# Patient Record
Sex: Female | Born: 1955 | Race: White | Hispanic: No | Marital: Married | Smoking: Current every day smoker
Health system: Southern US, Community
[De-identification: ages and names within clinical notes are randomized; demographics above are authoritative.]

## PROBLEM LIST (undated history)

## (undated) DIAGNOSIS — E039 Hypothyroidism, unspecified: Secondary | ICD-10-CM

## (undated) DIAGNOSIS — C4490 Unspecified malignant neoplasm of skin, unspecified: Secondary | ICD-10-CM

## (undated) DIAGNOSIS — S32010A Wedge compression fracture of first lumbar vertebra, initial encounter for closed fracture: Secondary | ICD-10-CM

## (undated) DIAGNOSIS — R319 Hematuria, unspecified: Secondary | ICD-10-CM

## (undated) DIAGNOSIS — M810 Age-related osteoporosis without current pathological fracture: Secondary | ICD-10-CM

## (undated) DIAGNOSIS — H04123 Dry eye syndrome of bilateral lacrimal glands: Secondary | ICD-10-CM

## (undated) DIAGNOSIS — I8390 Asymptomatic varicose veins of unspecified lower extremity: Secondary | ICD-10-CM

## (undated) DIAGNOSIS — K635 Polyp of colon: Secondary | ICD-10-CM

## (undated) DIAGNOSIS — N200 Calculus of kidney: Secondary | ICD-10-CM

## (undated) HISTORY — DX: Asymptomatic varicose veins of unspecified lower extremity: I83.90

## (undated) HISTORY — DX: Age-related osteoporosis without current pathological fracture: M81.0

## (undated) HISTORY — PX: ABDOMINAL HYSTERECTOMY: SHX81

## (undated) HISTORY — DX: Polyp of colon: K63.5

## (undated) HISTORY — DX: Hematuria, unspecified: R31.9

## (undated) HISTORY — PX: SPINE SURGERY: SHX786

## (undated) HISTORY — DX: Hypothyroidism, unspecified: E03.9

## (undated) HISTORY — DX: Unspecified malignant neoplasm of skin, unspecified: C44.90

## (undated) HISTORY — DX: Calculus of kidney: N20.0

## (undated) HISTORY — DX: Dry eye syndrome of bilateral lacrimal glands: H04.123

## (undated) HISTORY — PX: FRACTURE SURGERY: SHX138

## (undated) HISTORY — DX: Wedge compression fracture of first lumbar vertebra, initial encounter for closed fracture: S32.010A

---

## 1984-12-24 HISTORY — PX: OTHER SURGICAL HISTORY: SHX169

## 1996-12-24 HISTORY — PX: DILATION AND CURETTAGE OF UTERUS: SHX78

## 1997-12-24 HISTORY — PX: TOTAL ABDOMINAL HYSTERECTOMY: SHX209

## 1998-12-07 ENCOUNTER — Inpatient Hospital Stay (HOSPITAL_COMMUNITY): Admission: RE | Admit: 1998-12-07 | Discharge: 1998-12-08 | Payer: Self-pay | Admitting: Obstetrics & Gynecology

## 1998-12-29 ENCOUNTER — Ambulatory Visit (HOSPITAL_COMMUNITY): Admission: RE | Admit: 1998-12-29 | Discharge: 1998-12-29 | Payer: Self-pay | Admitting: Obstetrics and Gynecology

## 1998-12-29 ENCOUNTER — Encounter: Payer: Self-pay | Admitting: Obstetrics and Gynecology

## 2000-10-28 ENCOUNTER — Other Ambulatory Visit: Admission: RE | Admit: 2000-10-28 | Discharge: 2000-10-28 | Payer: Self-pay | Admitting: Obstetrics & Gynecology

## 2001-01-29 ENCOUNTER — Ambulatory Visit (HOSPITAL_COMMUNITY): Admission: RE | Admit: 2001-01-29 | Discharge: 2001-01-29 | Payer: Self-pay | Admitting: Gastroenterology

## 2001-11-05 ENCOUNTER — Other Ambulatory Visit: Admission: RE | Admit: 2001-11-05 | Discharge: 2001-11-05 | Payer: Self-pay | Admitting: Obstetrics & Gynecology

## 2002-11-23 ENCOUNTER — Other Ambulatory Visit: Admission: RE | Admit: 2002-11-23 | Discharge: 2002-11-23 | Payer: Self-pay | Admitting: Obstetrics & Gynecology

## 2002-12-24 HISTORY — PX: CERVICAL FUSION: SHX112

## 2003-08-02 ENCOUNTER — Encounter: Payer: Self-pay | Admitting: Orthopaedic Surgery

## 2003-08-04 ENCOUNTER — Ambulatory Visit (HOSPITAL_COMMUNITY): Admission: RE | Admit: 2003-08-04 | Discharge: 2003-08-05 | Payer: Self-pay | Admitting: Orthopaedic Surgery

## 2003-08-04 ENCOUNTER — Encounter: Payer: Self-pay | Admitting: Orthopaedic Surgery

## 2003-08-05 ENCOUNTER — Encounter: Payer: Self-pay | Admitting: Orthopaedic Surgery

## 2003-11-29 ENCOUNTER — Other Ambulatory Visit: Admission: RE | Admit: 2003-11-29 | Discharge: 2003-11-29 | Payer: Self-pay | Admitting: Obstetrics & Gynecology

## 2003-12-03 ENCOUNTER — Encounter: Admission: RE | Admit: 2003-12-03 | Discharge: 2003-12-03 | Payer: Self-pay | Admitting: Obstetrics & Gynecology

## 2004-09-23 DIAGNOSIS — N200 Calculus of kidney: Secondary | ICD-10-CM

## 2004-09-23 HISTORY — DX: Calculus of kidney: N20.0

## 2004-10-03 ENCOUNTER — Encounter: Admission: RE | Admit: 2004-10-03 | Discharge: 2004-10-03 | Payer: Self-pay | Admitting: Internal Medicine

## 2004-11-22 ENCOUNTER — Ambulatory Visit (HOSPITAL_BASED_OUTPATIENT_CLINIC_OR_DEPARTMENT_OTHER): Admission: RE | Admit: 2004-11-22 | Discharge: 2004-11-22 | Payer: Self-pay | Admitting: Orthopedic Surgery

## 2005-01-02 ENCOUNTER — Other Ambulatory Visit: Admission: RE | Admit: 2005-01-02 | Discharge: 2005-01-02 | Payer: Self-pay | Admitting: Obstetrics & Gynecology

## 2006-02-01 ENCOUNTER — Other Ambulatory Visit: Admission: RE | Admit: 2006-02-01 | Discharge: 2006-02-01 | Payer: Self-pay | Admitting: Obstetrics & Gynecology

## 2007-09-25 ENCOUNTER — Encounter: Admission: RE | Admit: 2007-09-25 | Discharge: 2007-09-25 | Payer: Self-pay | Admitting: Internal Medicine

## 2008-12-24 DIAGNOSIS — S32010A Wedge compression fracture of first lumbar vertebra, initial encounter for closed fracture: Secondary | ICD-10-CM

## 2008-12-24 HISTORY — DX: Wedge compression fracture of first lumbar vertebra, initial encounter for closed fracture: S32.010A

## 2010-11-08 ENCOUNTER — Ambulatory Visit: Payer: Self-pay | Admitting: Vascular Surgery

## 2011-01-13 ENCOUNTER — Encounter: Payer: Self-pay | Admitting: Obstetrics & Gynecology

## 2011-01-29 ENCOUNTER — Encounter (INDEPENDENT_AMBULATORY_CARE_PROVIDER_SITE_OTHER): Payer: Self-pay | Admitting: *Deleted

## 2011-02-08 ENCOUNTER — Encounter (INDEPENDENT_AMBULATORY_CARE_PROVIDER_SITE_OTHER): Payer: Self-pay | Admitting: *Deleted

## 2011-02-08 NOTE — Letter (Signed)
Summary: Pre Visit Letter Revised  [facility]  [address] [place], [place] [address]   Phone: [phone]  Fax: [phone]        01/29/2011 MRN: [id number] [facility] [address] [place], [place]  [address]             Procedure Date: February 26, 2011   recall col - Dr Tomasita Morrow to the Gastroenterology Division at [facility].    You are scheduled to see a nurse for your pre-procedure visit on February 12, 2011 at 10:00am on the 3rd floor at [facility], [address]  We ask that you try to arrive at our office 15 minutes prior to your appointment time to allow for check-in.  Please take a minute to review the attached form.  If you answer "Yes" to one or more of the questions on the first page, we ask that you call the person listed at your earliest opportunity.  If you answer "No" to all of the questions, please complete the rest of the form and bring it to your appointment.    Your nurse visit will consist of discussing your medical and surgical history, your immediate family medical history, and your medications.   If you are unable to list all of your medications on the form, please bring the medication bottles to your appointment and we will list them.  We will need to be aware of both prescribed and over the counter drugs.  We will need to know exact dosage information as well.    Please be prepared to read and sign documents such as consent forms, a financial agreement, and acknowledgement forms.  If necessary, and with your consent, a friend or relative is welcome to sit-in on the nurse visit with you.  Please bring your insurance card so that we may make a copy of it.  If your insurance requires a referral to see a specialist, please bring your referral form from your primary care physician.  No co-pay is required for this nurse visit.     If you cannot keep your appointment, please call [phone] to cancel or reschedule  prior to your appointment date.  This allows [place] the opportunity to schedule an appointment for another patient in need of care.    Thank you for choosing [facility] for your medical needs.  We appreciate the opportunity to care for you.  Please visit [place] at our website  to learn more about our practice.  Sincerely, The Gastroenterology Division

## 2011-02-12 ENCOUNTER — Encounter: Payer: Self-pay | Admitting: Gastroenterology

## 2011-02-13 ENCOUNTER — Encounter (INDEPENDENT_AMBULATORY_CARE_PROVIDER_SITE_OTHER): Payer: 59

## 2011-02-13 ENCOUNTER — Ambulatory Visit (INDEPENDENT_AMBULATORY_CARE_PROVIDER_SITE_OTHER): Payer: 59 | Admitting: Vascular Surgery

## 2011-02-13 DIAGNOSIS — I83893 Varicose veins of bilateral lower extremities with other complications: Secondary | ICD-10-CM

## 2011-02-14 NOTE — Assessment & Plan Note (Signed)
OFFICE VISIT  Sara Ingram, Sara Ingram DOB:  1956/09/10                                       02/13/2011 EAVWU#:[id number]  The patient presents today for continued discussion regarding her venous pathology.  She continues to have discomfort over varicosities in her posterior popliteal space extending down onto her calf and also on the left side, and then also on the right side she has pain over varicosities in her right medial calf.  She reports that she has had no improvement with compression garment usage.  She does continue to elevate her legs and take ibuprofen for the discomfort.  She reports that cooking, cleaning and shopping and all active requiring prolonged standing are difficult due to leg pain and also, she walks for exercise on a treadmill and this has had to reduce her duration due to leg pain as well.  Physical exam is unchanged.  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  She underwent formal venous duplex today in our office and this demonstrated reflux throughout her right great saphenous vein extending into the veins in her medial right calf.  On the left the varicosities arise off the small saphenous vein but there is no demonstratable reflux throughout the saphenous vein on the left popliteal area.  I have recommended treatment since she is having continued difficulty.  She reports that this is more so on the left than on the right.  On the left I have recommended stab phlebectomy over tributary varicosities and sclerotherapy of the telangiectasia that are causing pain in the skin themselves.  On the right I would recommend staged a treatment following left leg treatment. On the right I would recommend laser ablation of her right great saphenous vein and stab phlebectomy of varicosities, and she understands these are outpatient procedure under local anesthesia in our office and wished to  proceed as soon as possible.    Larina Earthly, M.D.  TFE/MEDQ  D:  02/13/2011  T:  02/14/2011  Job:  [id number]  cc:   Thora Lance, M.D.

## 2011-02-20 NOTE — Letter (Signed)
Summary: Moviprep Instructions  [facility]  [address]   [place], [place] [address]   Phone: [phone]  Fax: [phone]       Sara Ingram    1957/55/04    MRN: [id number]        Procedure Day /Date: Monday, 02-26-11     Arrival Time: 9:30 a.m.      Procedure Time: 10:30 a.m.     Location of Procedure:                     x  [facility] (4th Floor)                        PREPARATION FOR COLONOSCOPY WITH MOVIPREP   Starting 5 days prior to your procedure 02-21-11 do not eat nuts, seeds, popcorn, corn, beans, peas,  salads, or any raw vegetables.  Do not take any fiber supplements (e.g. Metamucil, Citrucel, and Benefiber).  THE DAY BEFORE YOUR PROCEDURE         DATE: 02-25-11  DAY: Sunday  1.  Drink clear liquids the entire day-NO SOLID FOOD  2.  Do not drink anything colored red or purple.  Avoid juices with pulp.  No orange juice.  3.  Drink at least 64 oz. (8 glasses) of fluid/clear liquids during the day to prevent dehydration and help the prep work efficiently.  CLEAR LIQUIDS INCLUDE: Water Jello Ice Popsicles Tea (sugar ok, no milk/cream) Powdered fruit flavored drinks Coffee (sugar ok, no milk/cream) Gatorade Juice: apple, white grape, white cranberry  Lemonade Clear bullion, consomm, broth Carbonated beverages (any kind) Strained chicken noodle soup Hard Candy                             4.  In the morning, mix first dose of MoviPrep solution:    Empty 1 Pouch A and 1 Pouch B into the disposable container    Add lukewarm drinking water to the top line of the container. Mix to dissolve    Refrigerate (mixed solution should be used within 24 hrs)  5.  Begin drinking the prep at 5:00 p.m. The MoviPrep container is divided by 4 marks.   Every 15 minutes drink the solution down to the next mark (approximately 8 oz) until the full liter is complete.   6.  Follow completed prep with 16 oz of clear liquid of your choice  (Nothing red or purple).  Continue to drink clear liquids until bedtime.  7.  Before going to bed, mix second dose of MoviPrep solution:    Empty 1 Pouch A and 1 Pouch B into the disposable container    Add lukewarm drinking water to the top line of the container. Mix to dissolve    Refrigerate  THE DAY OF YOUR PROCEDURE      DATE: 02-26-11 DAY: Monday  Beginning at 5:30 a.m. (5 hours before procedure):         1. Every 15 minutes, drink the solution down to the next mark (approx 8 oz) until the full liter is complete.  2. Follow completed prep with 16 oz. of clear liquid of your choice.    3. You may drink clear liquids until 8:30 a.m. (2 HOURS BEFORE PROCEDURE).   MEDICATION INSTRUCTIONS  Unless otherwise instructed, you should take regular prescription medications with a small sip of water   as early as possible the morning  of your procedure.           OTHER INSTRUCTIONS  You will need a responsible adult at least 55 years of age to accompany you and drive you home.   This person must remain in the waiting room during your procedure.  Wear loose fitting clothing that is easily removed.  Leave jewelry and other valuables at home.  However, you may wish to bring a book to read or  an iPod/MP3 player to listen to music as you wait for your procedure to start.  Remove all body piercing jewelry and leave at home.  Total time from sign-in until discharge is approximately 2-3 hours.  You should go home directly after your procedure and rest.  You can resume normal activities the  day after your procedure.  The day of your procedure you should not:   Drive   Make legal decisions   Operate machinery   Drink alcohol   Return to work  You will receive specific instructions about eating, activities and medications before you leave.    The above instructions have been reviewed and explained to me by   Ezra Sites RN  February 12, 2011 10:15 AM     I fully  understand and can verbalize these instructions _____________________________ Date _________

## 2011-02-20 NOTE — Miscellaneous (Signed)
Summary: [facility] PV  Clinical Lists Changes  Medications: Added new medication of MOVIPREP 100 GM  SOLR (PEG-KCL-NACL-NASULF-NA ASC-C) As per prep instructions. - Signed Rx of MOVIPREP 100 GM  SOLR (PEG-KCL-NACL-NASULF-NA ASC-C) As per prep instructions.;  #1 x 0;  Signed;  Entered by: Ezra Sites RN;  Authorized by: Mardella Layman MD [facility];  Method used: Electronically to [facility]. [place]. [phone]*, [address] [place], [place], [place]  [address], Ph: [phone] or [phone], Fax: [phone] Observations: Added new observation of NKA: T (02/12/2011 9:54)    Prescriptions: MOVIPREP 100 GM  SOLR (PEG-KCL-NACL-NASULF-NA ASC-C) As per prep instructions.  #1 x 0   Entered by:   Ezra Sites RN   Authorized by:   Mardella Layman MD [facility]   Signed by:   Ezra Sites RN on 02/12/2011   Method used:   Electronically to        [facility]. [address] [phone]* (retail)       [address]       [place], [place]  [address]       Ph: [phone] or [phone]       Fax: [phone]   RxID:   [id number]

## 2011-02-20 NOTE — Procedures (Unsigned)
LOWER EXTREMITY VENOUS REFLUX EXAM  INDICATION:  Varicose veins.  EXAM:  Using color-flow imaging and pulse Doppler spectral analysis, the right and left common femoral, superficial femoral, popliteal, posterior tibial, greater and lesser saphenous veins were evaluated.  There is evidence suggesting deep venous insufficiency in the right lower extremity.  The right and left saphenofemoral junction is competent.  The right GSV is not competent with reflux of 500 milliseconds with the caliber as described below.  The left greater saphenous vein is competent.  The right and left proximal short saphenous vein demonstrate competency.  GSV Diameter (used if found to be incompetent only)                                           Right    Left Proximal Greater Saphenous Vein           0.46 cm  cm Proximal-to-mid-thigh                     0.39 cm  cm Mid thigh                                 0.37 cm  cm Mid-distal thigh                          cm       cm Distal thigh                              0.31 cm  cm Knee                                      cm       cm  IMPRESSION:  The right greater saphenous vein is not competent with reflux of >500 milliseconds.  The left greater saphenous vein is competent.  The right and left greater saphenous vein is not tortuous. The deep venous systems on the right is not competent with reflux of >500 milliseconds.  The right and left short saphenous vein is competent.  The patient has had a history of bilateral venous treatment.  ___________________________________________ Larina Earthly, M.D.  OD/MEDQ  D:  02/13/2011  T:  02/13/2011  Job:  [id number]

## 2011-02-26 ENCOUNTER — Other Ambulatory Visit: Payer: Self-pay | Admitting: Gastroenterology

## 2011-02-26 ENCOUNTER — Other Ambulatory Visit (AMBULATORY_SURGERY_CENTER): Payer: 59 | Admitting: Gastroenterology

## 2011-02-26 DIAGNOSIS — Z86010 Personal history of colonic polyps: Secondary | ICD-10-CM

## 2011-02-26 DIAGNOSIS — K6389 Other specified diseases of intestine: Secondary | ICD-10-CM

## 2011-02-26 DIAGNOSIS — D126 Benign neoplasm of colon, unspecified: Secondary | ICD-10-CM

## 2011-02-26 DIAGNOSIS — Z1211 Encounter for screening for malignant neoplasm of colon: Secondary | ICD-10-CM

## 2011-03-02 ENCOUNTER — Encounter: Payer: Self-pay | Admitting: Gastroenterology

## 2011-03-06 NOTE — Procedures (Addendum)
Summary: Colonoscopy  Patient: [name] Note: All result statuses are Final unless otherwise noted.  Tests: (1) Colonoscopy (COL)   COL Colonoscopy           DONE     [facility]     [address]     [place], [place]  [address]          COLONOSCOPY PROCEDURE REPORT          PATIENT:  Sara Ingram, Sara Ingram  MR#:  [id number]     BIRTHDATE:  1956/06/18, 54 yrs. old  GENDER:  female     ENDOSCOPIST:  [name]. [name], MD, [facility]     REF. BY:     PROCEDURE DATE:  02/26/2011     PROCEDURE:  Colonoscopy with biopsy     ASA CLASS:  Class II     INDICATIONS:  history of hyperplastic polyps     MEDICATIONS:   Fentanyl 75 mcg IV, Versed 8 mg IV, Benadryl 25 mg     IV          DESCRIPTION OF PROCEDURE:   After the risks benefits and     alternatives of the procedure were thoroughly explained, informed     consent was obtained.  Digital rectal exam was performed and     revealed no abnormalities.   The LB CF-H180AL [id number] endoscope     was introduced through the anus and advanced to the cecum, which     was identified by both the appendix and ileocecal valve, without     limitations.  The quality of the prep was good, using MoviPrep.     The instrument was then slowly withdrawn as the colon was fully     examined.     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  There     were multiple polyps identified and removed. in the left colon.     SMALL 1-2 MM FLAT LEFT COLON POLYPS COLD BX. REMOVED.     Retroflexed views in the rectum revealed no abnormalities.    The     scope was then withdrawn from the patient and the procedure     completed.          COMPLICATIONS:  None     ENDOSCOPIC IMPRESSION:     1) Melanosis throughout the colon     2) Polyps, multiple in the left colon     R/O ADENOMAS,,,     RECOMMENDATIONS:     1) Repeat colonoscopy in 5 years if polyp adenomatous; otherwise     10 years     REPEAT EXAM:  No       ______________________________     [name]. [name], MD, [facility]          CC:  Kirby Funk, MD          n.     [name]:   [name]. Patterson at 02/26/2011 11:38 AM          [name], [id number]  Note: An exclamation mark (!) indicates a result that was not dispersed into the flowsheet. Document Creation Date: 02/26/2011 11:39 AM _______________________________________________________________________  (1) Order result status: Final Collection or observation date-time: 02/26/2011 11:30 Requested date-time:  Receipt date-time:  Reported date-time:  Referring Physician:   Ordering Physician: [name] [phone]) Specimen Source:  Source: [name] Order Number: [id number] Lab site:   Appended Document: Colonoscopy  five-year followup  Appended Document: Colonoscopy     Procedures Next Due Date:    Colonoscopy: 02/2016

## 2011-03-06 NOTE — Letter (Addendum)
Summary: Patient Notice- Polyp Results  [facility]  [address] [place], [place] [address]   Phone: [phone]  Fax: [phone]        March 02, 2011 MRN: [id number]    [facility] [address] [place], [place]  [address]    Dear Sara Ingram,  I am pleased to inform you that the colon polyp(s) removed during your recent colonoscopy was (were) found to be benign (no cancer detected) upon pathologic examination.  I recommend you have a repeat colonoscopy examination in 5_ years to look for recurrent polyps, as having colon polyps increases your risk for having recurrent polyps or even colon cancer in the future.  Should you develop new or worsening symptoms of abdominal pain, bowel habit changes or bleeding from the rectum or bowels, please schedule an evaluation with either your primary care physician or with me.  Additional information/recommendations:  _xx_ No further action with gastroenterology is needed at this time. Please      follow-up with your primary care physician for your other healthcare      needs.  __ Please call [phone] to schedule a return visit to review your      situation.  __ Please keep your follow-up visit as already scheduled.  __ Continue treatment plan as outlined the day of your exam.  Please call us if you are having persistent problems or have questions about your condition that have not been fully answered at this time.  Sincerely,  Mardella Layman MD [facility]  This letter has been electronically signed by your physician.  Appended Document: Patient Notice- Polyp Results letter mailed

## 2011-03-14 ENCOUNTER — Encounter (INDEPENDENT_AMBULATORY_CARE_PROVIDER_SITE_OTHER): Payer: 59 | Admitting: Vascular Surgery

## 2011-03-14 DIAGNOSIS — I83893 Varicose veins of bilateral lower extremities with other complications: Secondary | ICD-10-CM

## 2011-03-15 NOTE — Assessment & Plan Note (Signed)
OFFICE VISIT  Sara Ingram, Sara Ingram DOB:  [birth year]                                       03/14/2011 NWGNF#:[id number]  [name] presents today for treatment of her left leg venous pathology.  She underwent stab phlebectomy of 10-20 tributary varicosities in her popliteal space and posterior calf and also sclerotherapy of painful telangiectasia around this area.  She had no immediate complication and was discharged to home and will be seen again in several weeks for follow-up.    Larina Earthly, M.D. Electronically Signed  TFE/MEDQ  D:  03/14/2011  T:  03/15/2011  Job:  [id number]

## 2011-04-04 ENCOUNTER — Ambulatory Visit (INDEPENDENT_AMBULATORY_CARE_PROVIDER_SITE_OTHER): Payer: 59 | Admitting: Vascular Surgery

## 2011-04-04 DIAGNOSIS — I83893 Varicose veins of bilateral lower extremities with other complications: Secondary | ICD-10-CM

## 2011-04-05 NOTE — Assessment & Plan Note (Signed)
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  This was on March 14, 2011.  She is having a moderate amount of bruising and discomfort.  She has areas of thickening where the phlebectomy was undertaken with some blood under the skin.  She reports that she continued to have discomfort over this area which may be related to postoperative changes since she is now 3 weeks out.  She does not have any evidence of any infection or other postoperative complications.  She will continue to wear her compression garments since this is giving her some relief and will continue with her activity program.  I do not see any other options for treatment.  She does report some tingling sensation and numbness in the bottom of feet.  I feel that this is not related to venous pathology as we had discussed before.  I suggested that if this becomes progressive and causes significant difficulty with her, she may need to speak with Dr. Valentina Lucks about further workup.  She will notify us should she develop any future problems and see [place] on an as-needed basis.    Larina Earthly, M.D. Electronically Signed  TFE/MEDQ  D:  04/04/2011  T:  04/05/2011  Job:  [id number]  cc:   Thora Lance, M.D.

## 2011-05-08 NOTE — Consult Note (Signed)
NEW PATIENT CONSULTATION   Ingram, Sara S  DOB:  Aug 27, 1956                                       11/08/2010  GEXBM#:[id number]   Sara Ingram presents today for evaluation of bilateral venous  hypertension and painful varicosities.  She is a 55 year old white  female with a long history of venous pathology.  She had prior surgery  by Dr. Francina Ames in 1986 of the left posterior calf and subsequent had  further surgery by another surgeon at [facility] on her  right and left leg in 2004.  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  She does not know what treatment had been done  other than removal of the tributary varicosities..  She does not recall  any vein stripping.  She presents today with recurrent pain.  She has an  aching and throbbing sensation over the varicosities in her left  posterior calf and right medial calf.  She also has swelling with  prolonged standing.  She denies any bleeding but was concerned regarding  a very thin the area over the area below her popliteal fossa on the left  for bleeding.  She does have a history of DVT in her left leg in her 80s  at the time childbirth and was treated for some period time with  Coumadin.  She has no cardiac disease.  She does have history of spinal  fusion and kyphoplasty.  She has a history of prior hysterectomy and C-  section.   SOCIAL HISTORY:  She is married with 2 children.  She does not work  outside the home.  She does smoke less than half pack cigarettes per  day.  Does not drink alcohol.   FAMILY HISTORY:  Positive for varicose vein surgery in her father in his  13s.   REVIEW OF SYSTEMS:  No weight loss or gain.  She weighs 115 pounds.  She  is 5 feet 3 inches tall.  VASCULAR:  History of prior blood clots and pain in her legs with  walking.  Cardiac, GU, neurologic, pulmonary, hematologic, GU, GI and ENT are all  normal and negative.  MUSCULOSKELETAL:  Positive for arthritis, joint pain and muscle pain.  PSYCHIATRIC AND SKIN:  Negative.   PHYSICAL EXAMINATION:  Well developed, well nourished white female  appearing stated age in no acute distress.  Blood pressure is 100/66, pulse 68, respirations 16.  ENT:  Normal.  CHEST:  Clear bilaterally.  HEART:  Regular rate and rhythm.  ABDOMEN:  Soft, nontender.  MUSCULOSKELETAL:  Shows no major deformity or cyanosis.  NEUROLOGIC:  Without focal weakness or paresthesias.  SKIN:  Without ulcers or rashes.  She does have marked tributary  varicosities below the level of her left popliteal fossa and medial  calf.  She has incisions from prior phlebectomies over these areas.   She underwent a screening ultrasound by myself with Sono-Site and this  shows reflux from her small saphenous vein on the left into these  tributary varicosities and from the great saphenous vein on the right.  She has worn compression garments in the past but has not worn these  recently.  I explained the significance of her venous hypertension and  explained that in all likelihood her recurrent varicosities have been  related to no prior treatment of her  valvular incompetence and venous  hypertension from her saphenous veins.  She has been fitted today with  graduated compression garments 20-30 mmHg and instructed on their use.  I will see her again in 3 months with a formal duplex to determine if  that she has had any improvement with compression garments.  She  understands she is a candidate for laser ablation and stab phlebectomy  bilaterally should the had a compression garments and elevation and  ibuprofen fail to improve her symptoms.     Larina Earthly, M.D.  Electronically Signed   TFE/MEDQ  D:  11/08/2010  T:  11/09/2010  Job:  [id number]   cc:   Thora Lance, M.D.

## 2011-05-11 NOTE — H&P (Signed)
NAME:  Sara Ingram, Sara Ingram                       ACCOUNT NO.:  [id number]   MEDICAL RECORD NO.:  [id number]                   PATIENT TYPE:  OIB   LOCATION:  2899                                 FACILITY:  [facility]   PHYSICIAN:  Sharolyn Douglas, M.D.                     DATE OF BIRTH:  Apr 14, 1956   DATE OF ADMISSION:  08/04/2003  DATE OF DISCHARGE:                                HISTORY & PHYSICAL   CHIEF COMPLAINT:  Neck and left upper extremity pain.   HISTORY OF PRESENT ILLNESS:  The patient is a 55 year old female who has  been having neck and left upper extremity pain until approximately two  months ago when she was at work.  She works for a sh[employer].  She was trying  to move a heavy carton when she dropped it and jerked her neck.  She felt a  pop with immediate sensation of a burning pain in her neck and parascapular  area and into her left arm.  She was able to continue work for several days;  however, her pain continued and gradually increased and began moving all the  way down her arm and into her left hand.  She had been treated with numerous  types of conservative measures including anti-inflammatories, pain  medications, physical therapy which did not improve any of her symptoms.  MRI showed a large herniated disk at C5-6.  Secondary to this, as well as  her continued severe pain which was significantly affecting her quality of  life and activities of daily living, risks and benefits of the proposed  surgery were discussed with the patient by Dr. Noel Gerold, as well as myself.  She indicated understanding and opted to proceed.   ALLERGIES:  No known drug allergies.   MEDICATIONS:  Vicodin as needed.   PAST MEDICAL HISTORY:  Healthy.   PAST SURGICAL HISTORY:  Hysterectomy in 1999 and C-section x2 in 1981 and  1977.   SOCIAL HISTORY:  The patient denies tobacco use, denies alcohol use.  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  She has two grown children.  Her  husband  works full time but will be available to help her, as well as some  other family members help her through her postoperative course.   FAMILY MEDICAL HISTORY:  Mother alive at age 55 and healthy.  Father alive  at age 57 and healthy.  She has some siblings that are also healthy.   REVIEW OF SYSTEMS:  She does have nausea on occasion secondary to pain  medications.  Otherwise, she denies any fevers, chills, sweats, or bleeding  tendencies.  CNS:  Denies blurred vision, double vision, seizures,  headaches, paralysis.  CARDIOVASCULAR:  Denies chest pain, angina,  orthopnea, claudication, or palpitations.  PULMONARY:  Denies shortness of  breath, productive cough, or hemoptysis.  GI:  She has nausea as  previously  dictated.  Denies vomiting, constipation, diarrhea, bloody stool, or melena.  GU:  Denies dysuria, hematuria, or discharge.  MUSCULOSKELETAL:  As per HPI.   PHYSICAL EXAMINATION:  VITAL SIGNS:  Blood pressure 110/72, respirations 16  and unlabored, pulse is 82 and regular.  GENERAL:  The patient is a 55 year old female who is alert and oriented in  no acute distress.  She is well nourished, well groomed, appears her stated  age.  Pleasant and cooperative to examination.  She does appear to be quite  uncomfortable throughout the exam.  HEENT:  Head is normocephalic, atraumatic.  Pupils are equal, round, and  reactive.  Extraocular movements intact.  Nares patent.  Pharynx is clear.  NECK:  Soft to palpation.  No lymphadenopathy, thyromegaly, or bruits  appreciated.  CHEST:  Clear to auscultation bilaterally.  No rales, rhonchi, stridor,  wheezes, or friction rubs.  BREASTS:  Not pertinent, not performed.  HEART:  S1, S2.  Regular rate and rhythm.  No murmurs, gallops, or rubs were  noted.  ABDOMEN:  Soft to palpation.  Nontender, nondistended.  No organomegaly  noted.  Positive bowel sounds throughout.  GENITOURINARY:  Not pertinent, not performed.  EXTREMITIES:  The patient has  significant left upper extremity pain.  Motor  function is grossly intact.  Sensation is grossly intact to all extremities.  Pulses are intact and symmetric.  SKIN:  Intact without any lesions or rashes.   MRI shows a large HNP at C5-6.   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   PLAN:  Admit to [facility] on August 04, 2003 for an anterior  cervical diskectomy of C5-6 to be done by Dr. Sharolyn Douglas.      Verlin Fester, P.A.                       Sharolyn Douglas, M.D.    CM/MEDQ  D:  08/04/2003  T:  08/04/2003  Job:  [id number]

## 2011-05-11 NOTE — Op Note (Signed)
Sara Ingram, Sara Ingram                ACCOUNT NO.:  [id number]   MEDICAL RECORD NO.:  [id number]          PATIENT TYPE:  AMB   LOCATION:  N[facility]C                         FACILITY:  [facility]   PHYSICIAN:  Georges Lynch. Gioffre, M.D.DATE OF BIRTH:  11-26-1956   DATE OF PROCEDURE:  11/22/2004  DATE OF DISCHARGE:                                 OPERATIVE REPORT   PREOPERATIVE DIAGNOSIS:  Frozen shoulder on the left.   POSTOPERATIVE DIAGNOSIS:  Frozen shoulder on the left.   OPERATION:  1.  Closed manipulation left shoulder under general anesthesia.  2.  Did a sterile prep and injected her left shoulder with Kenalog 1 mL and      0.5% Marcaine 5 mL in left shoulder.   SURGEON:  Georges Lynch. Darrelyn Hillock, M.D.   ASSISTANT:  Nurse.   DESCRIPTION OF PROCEDURE:  Under general anesthesia, a gradual gentle  routine closed manipulation of her shoulder on the left was carried out.  It  was very difficult to get any further internal rotation on her but we did  try that as well.  We were able to easily get her arm above her head without  any problem in external rotation.  I then did a sterile prep and injected  her left shoulder with 1 mL Kenalog and 5 mL 0.5 Marcaine.  We will see her  in the office as scheduled for her therapy.      RAG/MEDQ  D:  11/22/2004  T:  11/22/2004  Job:  [id number]

## 2011-05-11 NOTE — Op Note (Signed)
NAME:  Sara Ingram, Sara Ingram                       ACCOUNT NO.:  [id number]   MEDICAL RECORD NO.:  [id number]                   PATIENT TYPE:  OIB   LOCATION:  2899                                 FACILITY:  [facility]   PHYSICIAN:  Sharolyn Douglas, M.D.                     DATE OF BIRTH:  11/16/1956   DATE OF PROCEDURE:  08/04/2003  DATE OF DISCHARGE:                                 OPERATIVE REPORT   PREOPERATIVE DIAGNOSIS:  C5-C6 herniated nucleus pulposus with severe left  C6 radiculopathy.   POSTOPERATIVE DIAGNOSIS:  C5-C6 herniated nucleus pulposus with severe left  C6 radiculopathy.   PROCEDURE:  1. Anterior cervical discectomy C5-C6 with decompression of the spinal cord     and nerve roots bilaterally.  2. Anterior cervical arthrodesis with placement of an 8 mm Synthes allograft     prosthesis spacer packed with demineralized bone matrix.  3. Anterior cervical plating utilizing the EBI VueLock system C5-C6.  4. Neuromonitoring utilizing somatosensory evoked potentials and EMGs.   SURGEON:  Sharolyn Douglas, M.D.   ASSISTANT:  Stacie Glaze, P.A.   ANESTHESIA:  General endotracheal anesthesia.   COMPLICATIONS:  None.   INDICATIONS FOR PROCEDURE:  The patient is a 55 year old female who was  injured at work two months ago.  She had immediate pain and burning in her  neck with radiation to the parascapular area and left arm.  She had an MRI  scan which showed a large herniated disc at C5-C6.  She has failed  conservative treatment options.  She has elected to undergo anterior  cervical discectomy and fusion at C5-C6 in hopes of improving her symptoms.  The risks, benefits, and alternatives were extensively reviewed.   PROCEDURE:  The patient was properly identified in the holding area and  taken to the operating room.  She underwent general endotracheal anesthesia  without difficulty.  She was given prophylactic IV antibiotics.  She was  carefully positioned onto the operating table.  Her  head was placed on the  [place] headrest.  5 pounds of halter traction was applied.  The neck was  placed in neutral position.  Neuromonitoring was established in the form of  SSEPs and upper extremity EMGs.  The neck was prepped and draped in the  usual sterile fashion.  A 3 cm incision was made on the left side in a  natural skin crease just above the cricoid cartilage.  Dissection was  carried sharply through the platysma.  The interval between the  sternocleidomastoid muscle and the strap muscles medially was developed down  to the prevertebral space.  The first disc identified was C5-C6.  This was  confirmed with interoperative x-ray.  The esophagus, trachea, and carotid  sheath were identified and protected at all times.  The longus colli muscle  was elevated out over the C5-C6 disc space bilaterally.  A deep retractor  was  placed.  Sharp annulotomy was performed.  Curets and pituitary rongeurs  were used to perform a subtotal discectomy back to the posterior  longitudinal ligament.  The surgical microscope was draped and brought into  the field.  Caspar pins were placed in the C5 and C6 vertebral bodies and  gentle distraction was applied.  A high speed bur was used to remove the  cartilaginous endplates as well as the uncovertebral joints bilaterally.  Foraminotomies were performed bilaterally with 2 mm Kerrison punches.  Two  moderate size pieces of disc material were removed from the left lateral  recess.  A rent in the posterior longitudinal ligament was identified.  The  PLL was taken down on the left side.  Additional disc material was found in  a subligamentous position.  The C6 nerve root was completely decompressed on  the left side.  The wound was copiously irrigated.  Gelfoam and bipolar  electrocautery was used to control bleeding.  We then placed an 8 mm Synthes  allograft prosthesis spacer that had been packed with demineralized bone  matrix.  The disc was carefully  recessed 1 mm.  We checked posterior to the  graft and there was plenty of room posteriorly.  We then placed an EBI  VueLock plate with four screws.  We confirmed each screw had engaged in the  locking mechanism.  Interoperative x-rays showed appropriate positioning of  the plate and screws.  The wound was again irrigated.  The esophagus,  trachea, and carotid sheath were inspected and there were no apparent  injuries.  We placed a deep Penrose drain.  The platysma was closed with an  interrupted 2-0 Vicryl, the subcutaneous layer closed with interrupted 3-0  Vicryl followed by a running 4-0 subcuticular Vicryl suture on the skin.  Benzoin and Steri-Strips were placed.  A sterile dressing was placed.  A  soft collar was placed.  The patient was extubated without difficulty,  transferred to the recovery room, able to move her upper and lower  extremities.                                               Sharolyn Douglas, M.D.    MC/MEDQ  D:  08/04/2003  T:  08/04/2003  Job:  [id number]

## 2011-09-27 ENCOUNTER — Other Ambulatory Visit: Payer: Self-pay | Admitting: Dermatology

## 2013-06-29 ENCOUNTER — Encounter (HOSPITAL_BASED_OUTPATIENT_CLINIC_OR_DEPARTMENT_OTHER): Payer: Self-pay

## 2013-06-29 ENCOUNTER — Ambulatory Visit (HOSPITAL_BASED_OUTPATIENT_CLINIC_OR_DEPARTMENT_OTHER): Admit: 2013-06-29 | Payer: 59 | Admitting: Orthopedic Surgery

## 2013-06-29 SURGERY — RELEASE, CARPAL TUNNEL, ENDOSCOPIC
Anesthesia: Monitor Anesthesia Care | Laterality: Right

## 2013-10-01 ENCOUNTER — Other Ambulatory Visit: Payer: Self-pay | Admitting: Dermatology

## 2014-07-01 ENCOUNTER — Ambulatory Visit
Admission: RE | Admit: 2014-07-01 | Discharge: 2014-07-01 | Disposition: A | Discharge status: Home / Self Care | Payer: Worker's Compensation | Source: Ambulatory Visit | Attending: Family | Admitting: Family

## 2014-07-01 ENCOUNTER — Other Ambulatory Visit: Payer: Self-pay | Admitting: Family

## 2014-07-01 DIAGNOSIS — R52 Pain, unspecified: Secondary | ICD-10-CM

## 2014-07-01 DIAGNOSIS — M25619 Stiffness of unspecified shoulder, not elsewhere classified: Secondary | ICD-10-CM

## 2014-10-12 ENCOUNTER — Ambulatory Visit (INDEPENDENT_AMBULATORY_CARE_PROVIDER_SITE_OTHER): Payer: 59 | Admitting: Physician Assistant

## 2014-10-12 VITALS — BP 124/72 | HR 82 | Temp 98.4°F | Resp 18 | Ht 62.5 in | Wt 126.2 lb

## 2014-10-12 DIAGNOSIS — H6122 Impacted cerumen, left ear: Secondary | ICD-10-CM

## 2014-10-12 NOTE — Progress Notes (Signed)
I was directly involved with the patient's care and agree with the physical, diagnosis and treatment plan.  

## 2014-10-12 NOTE — Progress Notes (Signed)
   Subjective:    Patient ID: Sara Ingram, female    DOB: 1956/09/15, 58 y.o.   MRN: [id number]  HPI Patient presents to clinic for 1 day of shooting pain in the left ear. Denies itching, drainage, or swelling of ear and no fever, headahce, or other URI sx. Has decreased hearing. Has used tylenol for pain with some relief. Denies past ear surgeries, but has had ears irrigated in the past. Right ear unaffected. Has controlled seasonal allergies.   Review of Systems  Constitutional: Negative for fever and chills.  HENT: Positive for ear pain and hearing loss. Negative for congestion, ear discharge, rhinorrhea, sinus pressure, sneezing and sore throat.   Eyes: Positive for redness (chronic dry eye). Negative for pain, discharge and itching.  Respiratory: Negative for cough and shortness of breath.   Cardiovascular: Negative for chest pain.  Skin: Negative for rash.  Allergic/Immunologic: Positive for environmental allergies (ragweed).  Neurological: Negative for dizziness, light-headedness and headaches.  Hematological: Negative for adenopathy.       Objective:   Physical Exam  Constitutional: She is oriented to person, place, and time. She appears well-developed and well-nourished. No distress.  Blood pressure 124/72, pulse 82, temperature 98.4 F (36.9 C), temperature source Oral, resp. rate 18, height 5' 2.5" (1.588 m), weight 126 lb 3.2 oz (57.244 kg), SpO2 95.00%.   HENT:  Head: Normocephalic and atraumatic.  Right Ear: There is tenderness. No drainage or swelling. A foreign body (cerumen impaction) is present. Decreased hearing is noted.  Left Ear: Hearing, tympanic membrane and external ear normal. No drainage, swelling or tenderness. A foreign body (some cerumen) is present.  No middle ear effusion. No decreased hearing is noted.  Nose: Nose normal.  Mouth/Throat: Oropharynx is clear and moist. No oropharyngeal exudate.  Eyes: Pupils are equal, round, and reactive to light.  Right eye exhibits no discharge. Left eye exhibits no discharge. No scleral icterus.  Neck: Neck supple.  Cardiovascular: Normal rate, regular rhythm and normal heart sounds.  Exam reveals no gallop and no friction rub.   No murmur heard. Pulmonary/Chest: Effort normal and breath sounds normal. No respiratory distress. She has no wheezes. She has no rales.  Lymphadenopathy:    She has no cervical adenopathy.  Neurological: She is alert and oriented to person, place, and time.  Skin: Skin is warm and dry. No rash noted. She is not diaphoretic. No erythema. No pallor.        Assessment & Plan:  1. Cerumen impaction, left Ear irrigated. Resolved. Encouraged to use Debrox in the future.    Alveta Heimlich PA-C  [facility] 10/12/2014 5:05 PM

## 2014-10-14 ENCOUNTER — Other Ambulatory Visit: Payer: Self-pay | Admitting: Obstetrics & Gynecology

## 2014-10-15 LAB — CYTOLOGY - PAP

## 2014-10-16 ENCOUNTER — Encounter: Payer: Self-pay | Admitting: *Deleted

## 2016-04-19 ENCOUNTER — Encounter: Payer: Self-pay | Admitting: Gastroenterology

## 2016-08-14 ENCOUNTER — Other Ambulatory Visit: Payer: Self-pay | Admitting: Internal Medicine

## 2016-08-14 ENCOUNTER — Ambulatory Visit
Admission: RE | Admit: 2016-08-14 | Discharge: 2016-08-14 | Disposition: A | Discharge status: Home / Self Care | Payer: 59 | Source: Ambulatory Visit | Attending: Internal Medicine | Admitting: Internal Medicine

## 2016-08-14 DIAGNOSIS — M25551 Pain in right hip: Secondary | ICD-10-CM

## 2017-04-17 DIAGNOSIS — J0100 Acute maxillary sinusitis, unspecified: Secondary | ICD-10-CM | POA: Diagnosis not present

## 2017-04-17 DIAGNOSIS — R05 Cough: Secondary | ICD-10-CM | POA: Diagnosis not present

## 2017-04-17 DIAGNOSIS — J40 Bronchitis, not specified as acute or chronic: Secondary | ICD-10-CM | POA: Diagnosis not present

## 2017-04-30 DIAGNOSIS — J0100 Acute maxillary sinusitis, unspecified: Secondary | ICD-10-CM | POA: Diagnosis not present

## 2017-06-11 ENCOUNTER — Ambulatory Visit
Admission: RE | Admit: 2017-06-11 | Discharge: 2017-06-11 | Disposition: A | Discharge status: Home / Self Care | Payer: 59 | Source: Ambulatory Visit | Attending: Internal Medicine | Admitting: Internal Medicine

## 2017-06-11 ENCOUNTER — Other Ambulatory Visit: Payer: Self-pay | Admitting: Internal Medicine

## 2017-06-11 DIAGNOSIS — R9389 Abnormal findings on diagnostic imaging of other specified body structures: Secondary | ICD-10-CM

## 2017-06-11 DIAGNOSIS — J40 Bronchitis, not specified as acute or chronic: Secondary | ICD-10-CM | POA: Diagnosis not present

## 2017-06-11 DIAGNOSIS — R938 Abnormal findings on diagnostic imaging of other specified body structures: Secondary | ICD-10-CM | POA: Diagnosis not present

## 2017-08-15 DIAGNOSIS — R5383 Other fatigue: Secondary | ICD-10-CM | POA: Diagnosis not present

## 2017-08-15 DIAGNOSIS — M810 Age-related osteoporosis without current pathological fracture: Secondary | ICD-10-CM | POA: Diagnosis not present

## 2017-08-15 DIAGNOSIS — E039 Hypothyroidism, unspecified: Secondary | ICD-10-CM | POA: Diagnosis not present

## 2017-08-23 DIAGNOSIS — D751 Secondary polycythemia: Secondary | ICD-10-CM | POA: Diagnosis not present

## 2017-10-21 DIAGNOSIS — E039 Hypothyroidism, unspecified: Secondary | ICD-10-CM | POA: Diagnosis not present

## 2017-11-25 DIAGNOSIS — R05 Cough: Secondary | ICD-10-CM | POA: Diagnosis not present

## 2017-11-25 DIAGNOSIS — J0101 Acute recurrent maxillary sinusitis: Secondary | ICD-10-CM | POA: Diagnosis not present

## 2017-12-26 DIAGNOSIS — Z01419 Encounter for gynecological examination (general) (routine) without abnormal findings: Secondary | ICD-10-CM | POA: Diagnosis not present

## 2018-02-01 DIAGNOSIS — N390 Urinary tract infection, site not specified: Secondary | ICD-10-CM | POA: Diagnosis not present

## 2018-05-14 DIAGNOSIS — L570 Actinic keratosis: Secondary | ICD-10-CM | POA: Diagnosis not present

## 2018-05-14 DIAGNOSIS — L820 Inflamed seborrheic keratosis: Secondary | ICD-10-CM | POA: Diagnosis not present

## 2018-05-14 DIAGNOSIS — L718 Other rosacea: Secondary | ICD-10-CM | POA: Diagnosis not present

## 2018-05-14 DIAGNOSIS — B078 Other viral warts: Secondary | ICD-10-CM | POA: Diagnosis not present

## 2018-05-26 ENCOUNTER — Ambulatory Visit
Admission: RE | Admit: 2018-05-26 | Discharge: 2018-05-26 | Disposition: A | Discharge status: Home / Self Care | Payer: 59 | Source: Ambulatory Visit | Attending: Internal Medicine | Admitting: Internal Medicine

## 2018-05-26 ENCOUNTER — Other Ambulatory Visit: Payer: Self-pay | Admitting: Internal Medicine

## 2018-05-26 DIAGNOSIS — M79671 Pain in right foot: Secondary | ICD-10-CM | POA: Diagnosis not present

## 2018-05-26 DIAGNOSIS — S99911A Unspecified injury of right ankle, initial encounter: Secondary | ICD-10-CM | POA: Diagnosis not present

## 2018-05-26 DIAGNOSIS — M25571 Pain in right ankle and joints of right foot: Secondary | ICD-10-CM | POA: Diagnosis not present

## 2018-05-26 DIAGNOSIS — S92251A Displaced fracture of navicular [scaphoid] of right foot, initial encounter for closed fracture: Secondary | ICD-10-CM | POA: Diagnosis not present

## 2018-06-02 DIAGNOSIS — M79671 Pain in right foot: Secondary | ICD-10-CM | POA: Diagnosis not present

## 2018-06-13 DIAGNOSIS — M79671 Pain in right foot: Secondary | ICD-10-CM | POA: Diagnosis not present

## 2018-07-04 DIAGNOSIS — M79671 Pain in right foot: Secondary | ICD-10-CM | POA: Diagnosis not present

## 2018-07-09 DIAGNOSIS — D485 Neoplasm of uncertain behavior of skin: Secondary | ICD-10-CM | POA: Diagnosis not present

## 2018-07-09 DIAGNOSIS — B079 Viral wart, unspecified: Secondary | ICD-10-CM | POA: Diagnosis not present

## 2018-07-09 DIAGNOSIS — L570 Actinic keratosis: Secondary | ICD-10-CM | POA: Diagnosis not present

## 2018-07-25 DIAGNOSIS — M25571 Pain in right ankle and joints of right foot: Secondary | ICD-10-CM | POA: Diagnosis not present

## 2018-08-21 DIAGNOSIS — S92901D Unspecified fracture of right foot, subsequent encounter for fracture with routine healing: Secondary | ICD-10-CM | POA: Diagnosis not present

## 2018-08-21 DIAGNOSIS — M810 Age-related osteoporosis without current pathological fracture: Secondary | ICD-10-CM | POA: Diagnosis not present

## 2018-08-21 DIAGNOSIS — R82998 Other abnormal findings in urine: Secondary | ICD-10-CM | POA: Diagnosis not present

## 2018-08-21 DIAGNOSIS — E039 Hypothyroidism, unspecified: Secondary | ICD-10-CM | POA: Diagnosis not present

## 2018-08-21 DIAGNOSIS — Z79899 Other long term (current) drug therapy: Secondary | ICD-10-CM | POA: Diagnosis not present

## 2018-08-21 DIAGNOSIS — Z0000 Encounter for general adult medical examination without abnormal findings: Secondary | ICD-10-CM | POA: Diagnosis not present

## 2019-02-05 DIAGNOSIS — Z6825 Body mass index (BMI) 25.0-25.9, adult: Secondary | ICD-10-CM | POA: Diagnosis not present

## 2019-02-05 DIAGNOSIS — Z01419 Encounter for gynecological examination (general) (routine) without abnormal findings: Secondary | ICD-10-CM | POA: Diagnosis not present

## 2019-02-16 ENCOUNTER — Other Ambulatory Visit: Payer: Self-pay | Admitting: Nurse Practitioner

## 2019-02-16 ENCOUNTER — Ambulatory Visit
Admission: RE | Admit: 2019-02-16 | Discharge: 2019-02-16 | Disposition: A | Discharge status: Home / Self Care | Payer: No Typology Code available for payment source | Source: Ambulatory Visit | Attending: Nurse Practitioner | Admitting: Nurse Practitioner

## 2019-02-16 DIAGNOSIS — M25432 Effusion, left wrist: Secondary | ICD-10-CM

## 2019-02-16 DIAGNOSIS — M25532 Pain in left wrist: Secondary | ICD-10-CM

## 2019-02-16 DIAGNOSIS — R202 Paresthesia of skin: Secondary | ICD-10-CM

## 2019-02-16 DIAGNOSIS — R200 Anesthesia of skin: Secondary | ICD-10-CM

## 2020-02-18 ENCOUNTER — Other Ambulatory Visit: Payer: Self-pay | Admitting: Obstetrics & Gynecology

## 2020-02-18 DIAGNOSIS — R928 Other abnormal and inconclusive findings on diagnostic imaging of breast: Secondary | ICD-10-CM

## 2020-02-25 ENCOUNTER — Ambulatory Visit: Payer: 59

## 2020-02-25 ENCOUNTER — Other Ambulatory Visit: Payer: Self-pay

## 2020-02-25 ENCOUNTER — Ambulatory Visit
Admission: RE | Admit: 2020-02-25 | Discharge: 2020-02-25 | Disposition: A | Discharge status: Home / Self Care | Payer: 59 | Source: Ambulatory Visit | Attending: Obstetrics & Gynecology | Admitting: Obstetrics & Gynecology

## 2020-02-25 DIAGNOSIS — R928 Other abnormal and inconclusive findings on diagnostic imaging of breast: Secondary | ICD-10-CM

## 2021-03-07 ENCOUNTER — Other Ambulatory Visit: Payer: Self-pay | Admitting: Obstetrics & Gynecology

## 2021-03-07 DIAGNOSIS — R928 Other abnormal and inconclusive findings on diagnostic imaging of breast: Secondary | ICD-10-CM

## 2021-03-24 ENCOUNTER — Ambulatory Visit
Admission: RE | Admit: 2021-03-24 | Discharge: 2021-03-24 | Disposition: A | Discharge status: Home / Self Care | Payer: 59 | Source: Ambulatory Visit | Attending: Obstetrics & Gynecology | Admitting: Obstetrics & Gynecology

## 2021-03-24 ENCOUNTER — Other Ambulatory Visit: Payer: Self-pay | Admitting: Obstetrics & Gynecology

## 2021-03-24 ENCOUNTER — Other Ambulatory Visit: Payer: Self-pay

## 2021-03-24 DIAGNOSIS — R928 Other abnormal and inconclusive findings on diagnostic imaging of breast: Secondary | ICD-10-CM

## 2021-03-24 DIAGNOSIS — N6320 Unspecified lump in the left breast, unspecified quadrant: Secondary | ICD-10-CM

## 2021-03-28 ENCOUNTER — Other Ambulatory Visit: Payer: 59

## 2021-03-30 ENCOUNTER — Ambulatory Visit
Admission: RE | Admit: 2021-03-30 | Discharge: 2021-03-30 | Disposition: A | Discharge status: Home / Self Care | Payer: 59 | Source: Ambulatory Visit | Attending: Obstetrics & Gynecology | Admitting: Obstetrics & Gynecology

## 2021-03-30 ENCOUNTER — Other Ambulatory Visit: Payer: Self-pay

## 2021-03-30 ENCOUNTER — Other Ambulatory Visit: Payer: Self-pay | Admitting: Obstetrics & Gynecology

## 2021-03-30 DIAGNOSIS — N6320 Unspecified lump in the left breast, unspecified quadrant: Secondary | ICD-10-CM

## 2021-04-04 ENCOUNTER — Other Ambulatory Visit: Payer: Self-pay | Admitting: Obstetrics & Gynecology

## 2021-04-04 DIAGNOSIS — N649 Disorder of breast, unspecified: Secondary | ICD-10-CM

## 2021-10-06 ENCOUNTER — Ambulatory Visit
Admission: RE | Admit: 2021-10-06 | Discharge: 2021-10-06 | Disposition: A | Discharge status: Home / Self Care | Payer: 59 | Source: Ambulatory Visit | Attending: Obstetrics & Gynecology | Admitting: Obstetrics & Gynecology

## 2021-10-06 ENCOUNTER — Other Ambulatory Visit: Payer: Self-pay

## 2021-10-06 DIAGNOSIS — N649 Disorder of breast, unspecified: Secondary | ICD-10-CM

## 2022-03-08 DIAGNOSIS — E039 Hypothyroidism, unspecified: Secondary | ICD-10-CM | POA: Diagnosis not present

## 2022-03-08 DIAGNOSIS — M8588 Other specified disorders of bone density and structure, other site: Secondary | ICD-10-CM | POA: Diagnosis not present

## 2022-04-11 DIAGNOSIS — M7061 Trochanteric bursitis, right hip: Secondary | ICD-10-CM | POA: Diagnosis not present

## 2022-04-11 DIAGNOSIS — M25551 Pain in right hip: Secondary | ICD-10-CM | POA: Diagnosis not present

## 2022-04-16 DIAGNOSIS — L718 Other rosacea: Secondary | ICD-10-CM | POA: Diagnosis not present

## 2022-04-16 DIAGNOSIS — L821 Other seborrheic keratosis: Secondary | ICD-10-CM | POA: Diagnosis not present

## 2022-04-16 DIAGNOSIS — L820 Inflamed seborrheic keratosis: Secondary | ICD-10-CM | POA: Diagnosis not present

## 2022-04-16 DIAGNOSIS — L570 Actinic keratosis: Secondary | ICD-10-CM | POA: Diagnosis not present

## 2022-04-16 DIAGNOSIS — D225 Melanocytic nevi of trunk: Secondary | ICD-10-CM | POA: Diagnosis not present

## 2022-05-09 DIAGNOSIS — M545 Low back pain, unspecified: Secondary | ICD-10-CM | POA: Diagnosis not present

## 2022-05-09 DIAGNOSIS — M5106 Intervertebral disc disorders with myelopathy, lumbar region: Secondary | ICD-10-CM | POA: Diagnosis not present

## 2022-05-10 ENCOUNTER — Other Ambulatory Visit: Payer: Self-pay | Admitting: Orthopaedic Surgery

## 2022-05-10 DIAGNOSIS — M545 Low back pain, unspecified: Secondary | ICD-10-CM

## 2022-05-22 ENCOUNTER — Ambulatory Visit
Admission: RE | Admit: 2022-05-22 | Discharge: 2022-05-22 | Disposition: A | Discharge status: Home / Self Care | Payer: Medicare Other | Source: Ambulatory Visit | Attending: Orthopaedic Surgery | Admitting: Orthopaedic Surgery

## 2022-05-22 DIAGNOSIS — M48061 Spinal stenosis, lumbar region without neurogenic claudication: Secondary | ICD-10-CM | POA: Diagnosis not present

## 2022-05-22 DIAGNOSIS — M545 Low back pain, unspecified: Secondary | ICD-10-CM | POA: Diagnosis not present

## 2022-05-23 ENCOUNTER — Other Ambulatory Visit: Payer: Medicare Other

## 2022-05-23 DIAGNOSIS — S3210XA Unspecified fracture of sacrum, initial encounter for closed fracture: Secondary | ICD-10-CM | POA: Diagnosis not present

## 2022-05-23 DIAGNOSIS — M545 Low back pain, unspecified: Secondary | ICD-10-CM | POA: Diagnosis not present

## 2022-05-23 DIAGNOSIS — M47896 Other spondylosis, lumbar region: Secondary | ICD-10-CM | POA: Diagnosis not present

## 2022-05-30 ENCOUNTER — Other Ambulatory Visit: Payer: Medicare Other

## 2022-06-12 ENCOUNTER — Other Ambulatory Visit: Payer: Self-pay | Admitting: Internal Medicine

## 2022-06-12 DIAGNOSIS — N63 Unspecified lump in unspecified breast: Secondary | ICD-10-CM

## 2022-06-25 ENCOUNTER — Ambulatory Visit
Admission: RE | Admit: 2022-06-25 | Discharge: 2022-06-25 | Disposition: A | Discharge status: Home / Self Care | Payer: Medicare Other | Source: Ambulatory Visit | Attending: Internal Medicine | Admitting: Internal Medicine

## 2022-06-25 DIAGNOSIS — R928 Other abnormal and inconclusive findings on diagnostic imaging of breast: Secondary | ICD-10-CM | POA: Diagnosis not present

## 2022-06-25 DIAGNOSIS — N63 Unspecified lump in unspecified breast: Secondary | ICD-10-CM

## 2022-07-09 DIAGNOSIS — M85852 Other specified disorders of bone density and structure, left thigh: Secondary | ICD-10-CM | POA: Diagnosis not present

## 2022-07-09 DIAGNOSIS — S3210XA Unspecified fracture of sacrum, initial encounter for closed fracture: Secondary | ICD-10-CM | POA: Diagnosis not present

## 2022-07-13 DIAGNOSIS — R9389 Abnormal findings on diagnostic imaging of other specified body structures: Secondary | ICD-10-CM | POA: Diagnosis not present

## 2022-07-13 DIAGNOSIS — N281 Cyst of kidney, acquired: Secondary | ICD-10-CM | POA: Diagnosis not present

## 2022-10-19 DIAGNOSIS — Z0000 Encounter for general adult medical examination without abnormal findings: Secondary | ICD-10-CM | POA: Diagnosis not present

## 2022-10-19 DIAGNOSIS — E039 Hypothyroidism, unspecified: Secondary | ICD-10-CM | POA: Diagnosis not present

## 2022-10-19 DIAGNOSIS — E7800 Pure hypercholesterolemia, unspecified: Secondary | ICD-10-CM | POA: Diagnosis not present

## 2022-10-19 DIAGNOSIS — M810 Age-related osteoporosis without current pathological fracture: Secondary | ICD-10-CM | POA: Diagnosis not present

## 2022-10-19 DIAGNOSIS — F17210 Nicotine dependence, cigarettes, uncomplicated: Secondary | ICD-10-CM | POA: Diagnosis not present

## 2022-10-19 DIAGNOSIS — Z23 Encounter for immunization: Secondary | ICD-10-CM | POA: Diagnosis not present

## 2022-10-23 DIAGNOSIS — E559 Vitamin D deficiency, unspecified: Secondary | ICD-10-CM | POA: Diagnosis not present

## 2022-10-23 DIAGNOSIS — M8580 Other specified disorders of bone density and structure, unspecified site: Secondary | ICD-10-CM | POA: Diagnosis not present

## 2022-10-23 DIAGNOSIS — D649 Anemia, unspecified: Secondary | ICD-10-CM | POA: Diagnosis not present

## 2022-10-23 DIAGNOSIS — E039 Hypothyroidism, unspecified: Secondary | ICD-10-CM | POA: Diagnosis not present

## 2022-10-23 DIAGNOSIS — F17210 Nicotine dependence, cigarettes, uncomplicated: Secondary | ICD-10-CM | POA: Diagnosis not present

## 2022-11-10 ENCOUNTER — Encounter: Payer: Self-pay | Admitting: *Deleted

## 2022-11-19 DIAGNOSIS — D3101 Benign neoplasm of right conjunctiva: Secondary | ICD-10-CM | POA: Diagnosis not present

## 2022-11-19 DIAGNOSIS — H2513 Age-related nuclear cataract, bilateral: Secondary | ICD-10-CM | POA: Diagnosis not present

## 2022-11-28 ENCOUNTER — Encounter: Payer: Self-pay | Admitting: *Deleted

## 2022-11-28 NOTE — Progress Notes (Signed)
Pt registered for 11/10/22 event but no v/s or other data noted in [facility] for follow up. Patient last saw PCP Lavone Orn in 7/23. No further health equity support indicated at this time.

## 2023-01-08 DIAGNOSIS — Z79899 Other long term (current) drug therapy: Secondary | ICD-10-CM | POA: Diagnosis not present

## 2023-01-08 DIAGNOSIS — E559 Vitamin D deficiency, unspecified: Secondary | ICD-10-CM | POA: Diagnosis not present

## 2023-01-08 DIAGNOSIS — E7800 Pure hypercholesterolemia, unspecified: Secondary | ICD-10-CM | POA: Diagnosis not present

## 2023-06-26 ENCOUNTER — Ambulatory Visit: Payer: Medicare Other | Admitting: Podiatry

## 2023-06-26 ENCOUNTER — Ambulatory Visit (INDEPENDENT_AMBULATORY_CARE_PROVIDER_SITE_OTHER): Payer: Medicare Other

## 2023-06-26 DIAGNOSIS — M778 Other enthesopathies, not elsewhere classified: Secondary | ICD-10-CM

## 2023-06-26 DIAGNOSIS — S93602A Unspecified sprain of left foot, initial encounter: Secondary | ICD-10-CM

## 2023-06-26 DIAGNOSIS — S99922A Unspecified injury of left foot, initial encounter: Secondary | ICD-10-CM | POA: Diagnosis not present

## 2023-06-26 MED ORDER — MELOXICAM 15 MG PO TABS: 15.0000 mg | ORAL_TABLET | Freq: Every day | ORAL | 1 refills | Status: DC

## 2023-06-26 MED ORDER — BETAMETHASONE SOD PHOS & ACET 6 (3-3) MG/ML IJ SUSP
3.0000 mg | Freq: Once | INTRAMUSCULAR | Status: AC
  Administered 2023-06-26: 3 mg via INTRA_ARTICULAR

## 2023-06-26 NOTE — Progress Notes (Addendum)
   Chief Complaint  Patient presents with   Foot Injury    Patient fell 4 wks ago, bruising and swelling w/ pain    HPI: 67 y.o. female presenting today as a new patient for evaluation of a left foot injury that occurred about 4 weeks ago.  Patient has bruising and swelling with pain.  She continues to have pain with ambulation on her left foot.  She has tried resting with ice and compression and elevation and there is been minimal relief.  She is also taking Aleve and Tylenol as needed  Past Medical History:  Diagnosis Date   Colon polyps    Compression fracture of L1 lumbar vertebra (HCC) 2010   Dry eyes    Hematuria    work up negative   Hypothyroidism    Osteoporosis    Renal stone 09/2004   left on CT   Skin cancer    squamous cell   Varicose veins     Past Surgical History:  Procedure Laterality Date   ABDOMINAL HYSTERECTOMY     CERVICAL FUSION  2004   CESAREAN SECTION     DILATION AND CURETTAGE OF UTERUS  1998   FRACTURE SURGERY     SPINE SURGERY     TOTAL ABDOMINAL HYSTERECTOMY  1999   vein  procedures  1986    No Known Allergies   Physical Exam: General: The patient is alert and oriented x3 in no acute distress.  Dermatology: Skin is warm, dry and supple bilateral lower extremities.   Vascular: Palpable pedal pulses bilaterally. Capillary refill within normal limits.  Mild edema noted to the left foot  Neurological: Grossly intact via light touch  Musculoskeletal Exam: No pedal deformities noted.  Tenderness with palpation specifically to the Lisfranc joint of the left foot  Radiographic Exam LT foot 06/26/2023:  Normal osseous mineralization. Joint spaces preserved.  No fractures or osseous irregularities noted.  Impression: Negative  Assessment/Plan of Care: 1.  Lisfranc injury left foot 2.  Capsulitis left foot  -Patient evaluated.  X-rays reviewed -Injection of 0.5 cc Celestone Soluspan injected around the Lisfranc left -Cam boot dispensed.   WBAT -Compression ankle sleeve dispensed.  Wear daily -Continue rest with ice and elevation -Patient has a knee scooter at home.  Continue -Prescription for meloxicam 15 mg daily -Return to clinic 4 weeks.  If there is no improvement recommend MRI       Sara Ingram, DPM [facility]  Dr. Felecia Ingram, DPM    [address] [place], [place] [address]                Office [phone]  Fax [phone]

## 2023-07-02 ENCOUNTER — Telehealth: Payer: Self-pay | Admitting: *Deleted

## 2023-07-02 MED ORDER — MELOXICAM 15 MG PO TABS: 15.0000 mg | ORAL_TABLET | Freq: Every day | ORAL | 1 refills | Status: AC

## 2023-07-02 NOTE — Telephone Encounter (Signed)
Patient did not get her medicine(Mobic),  resent.

## 2023-07-02 NOTE — Addendum Note (Signed)
Addended byMaury Dus, Kaleah Hagemeister L on: 07/02/2023 03:29 PM   Modules accepted: Orders

## 2023-07-17 ENCOUNTER — Ambulatory Visit: Payer: Medicare Other | Admitting: Podiatry

## 2023-07-17 ENCOUNTER — Other Ambulatory Visit: Payer: Self-pay | Admitting: Physician Assistant

## 2023-07-17 DIAGNOSIS — Z1231 Encounter for screening mammogram for malignant neoplasm of breast: Secondary | ICD-10-CM

## 2023-07-30 ENCOUNTER — Ambulatory Visit: Admission: RE | Admit: 2023-07-30 | Payer: Medicare Other | Source: Ambulatory Visit

## 2023-07-30 DIAGNOSIS — Z1231 Encounter for screening mammogram for malignant neoplasm of breast: Secondary | ICD-10-CM

## 2023-09-03 DIAGNOSIS — Z86010 Personal history of colonic polyps: Secondary | ICD-10-CM | POA: Diagnosis not present

## 2023-09-03 DIAGNOSIS — D120 Benign neoplasm of cecum: Secondary | ICD-10-CM | POA: Diagnosis not present

## 2023-09-03 DIAGNOSIS — Z09 Encounter for follow-up examination after completed treatment for conditions other than malignant neoplasm: Secondary | ICD-10-CM | POA: Diagnosis not present

## 2023-09-05 DIAGNOSIS — D120 Benign neoplasm of cecum: Secondary | ICD-10-CM | POA: Diagnosis not present

## 2023-09-23 DIAGNOSIS — L570 Actinic keratosis: Secondary | ICD-10-CM | POA: Diagnosis not present

## 2023-09-23 DIAGNOSIS — D1801 Hemangioma of skin and subcutaneous tissue: Secondary | ICD-10-CM | POA: Diagnosis not present

## 2023-09-23 DIAGNOSIS — L920 Granuloma annulare: Secondary | ICD-10-CM | POA: Diagnosis not present

## 2023-09-23 DIAGNOSIS — L820 Inflamed seborrheic keratosis: Secondary | ICD-10-CM | POA: Diagnosis not present

## 2023-09-23 DIAGNOSIS — M6740 Ganglion, unspecified site: Secondary | ICD-10-CM | POA: Diagnosis not present

## 2023-09-23 DIAGNOSIS — L718 Other rosacea: Secondary | ICD-10-CM | POA: Diagnosis not present

## 2023-09-23 DIAGNOSIS — L821 Other seborrheic keratosis: Secondary | ICD-10-CM | POA: Diagnosis not present

## 2023-10-22 DIAGNOSIS — E7800 Pure hypercholesterolemia, unspecified: Secondary | ICD-10-CM | POA: Diagnosis not present

## 2023-10-22 DIAGNOSIS — Z0000 Encounter for general adult medical examination without abnormal findings: Secondary | ICD-10-CM | POA: Diagnosis not present

## 2023-10-22 DIAGNOSIS — E039 Hypothyroidism, unspecified: Secondary | ICD-10-CM | POA: Diagnosis not present

## 2023-10-22 DIAGNOSIS — M810 Age-related osteoporosis without current pathological fracture: Secondary | ICD-10-CM | POA: Diagnosis not present

## 2023-10-22 DIAGNOSIS — F17210 Nicotine dependence, cigarettes, uncomplicated: Secondary | ICD-10-CM | POA: Diagnosis not present

## 2023-10-22 DIAGNOSIS — M25512 Pain in left shoulder: Secondary | ICD-10-CM | POA: Diagnosis not present

## 2023-10-22 DIAGNOSIS — I700 Atherosclerosis of aorta: Secondary | ICD-10-CM | POA: Diagnosis not present

## 2023-10-22 DIAGNOSIS — Z23 Encounter for immunization: Secondary | ICD-10-CM | POA: Diagnosis not present

## 2023-11-28 ENCOUNTER — Other Ambulatory Visit: Payer: Self-pay | Admitting: Physician Assistant

## 2023-11-28 ENCOUNTER — Ambulatory Visit
Admission: RE | Admit: 2023-11-28 | Discharge: 2023-11-28 | Disposition: A | Discharge status: Home / Self Care | Payer: Medicare Other | Source: Ambulatory Visit | Attending: Physician Assistant | Admitting: Physician Assistant

## 2023-11-28 ENCOUNTER — Other Ambulatory Visit: Payer: Medicare Other

## 2023-11-28 DIAGNOSIS — Z8781 Personal history of (healed) traumatic fracture: Secondary | ICD-10-CM

## 2023-11-28 DIAGNOSIS — M5442 Lumbago with sciatica, left side: Secondary | ICD-10-CM | POA: Diagnosis not present

## 2023-11-28 DIAGNOSIS — M545 Low back pain, unspecified: Secondary | ICD-10-CM | POA: Diagnosis not present

## 2023-11-28 DIAGNOSIS — M25559 Pain in unspecified hip: Secondary | ICD-10-CM | POA: Diagnosis not present

## 2023-11-28 DIAGNOSIS — M549 Dorsalgia, unspecified: Secondary | ICD-10-CM | POA: Diagnosis not present

## 2023-11-28 DIAGNOSIS — M5441 Lumbago with sciatica, right side: Secondary | ICD-10-CM | POA: Diagnosis not present

## 2023-12-25 IMAGING — MR MR LUMBAR SPINE W/O CM
4 of 6 series · 20 of 48 positions shown · non-contrast
Comparison: Radiographs 08/14/2016

CLINICAL DATA: Low back pain radiating to the buttocks and legs
over the last 2 months, left greater than right.

EXAM:
MRI LUMBAR SPINE WITHOUT CONTRAST
TECHNIQUE: Multiplanar, multisequence MR imaging of the lumbar spine was
performed. No intravenous contrast was administered.

[Series 5: T2 · sagittal · 4.0mm · 0.73mm/px · 5 of 17 slices shown (1 of 3)]
[im 1/17]
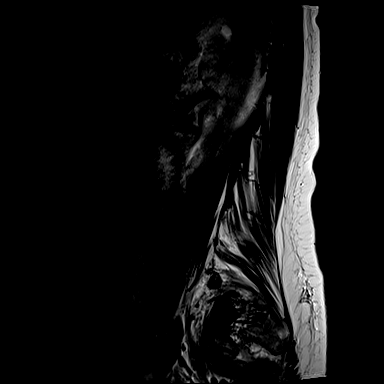
[im 5/17]
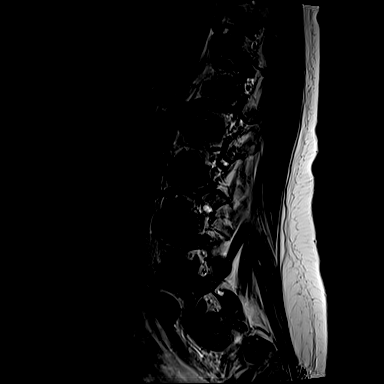
[im 9/17]
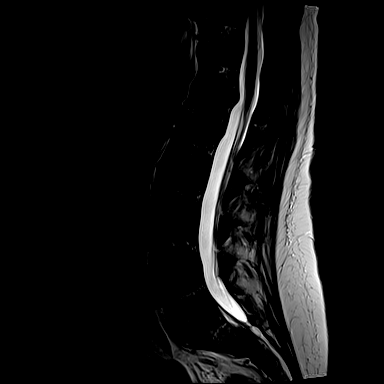
[im 13/17]
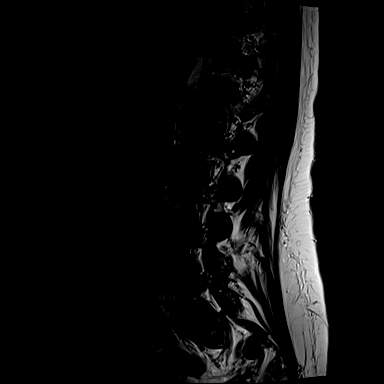
[im 17/17]
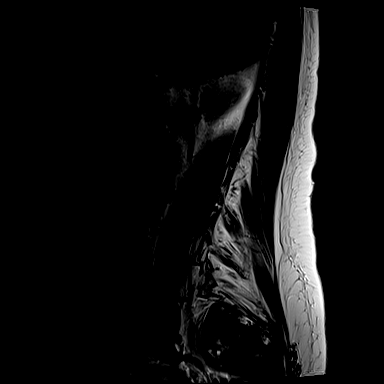

[Series 6: T1 · sagittal · 4.0mm · 0.73mm/px · 3 of 9 slices shown]
[im 1/9]
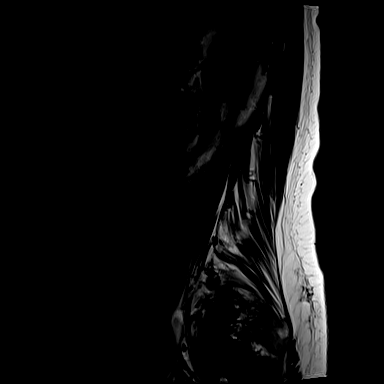
[im 5/9]
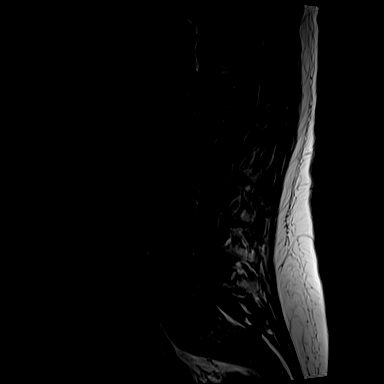
[im 9/9]
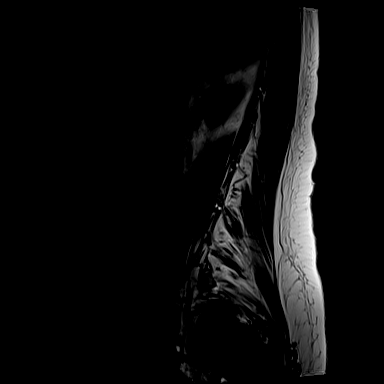

[Series 13: T2 · axial · 4.0mm · 0.28mm/px · z∈[-116,+140]mm · 9 of 50 slices shown (2 of 3)]
[im 1/50]
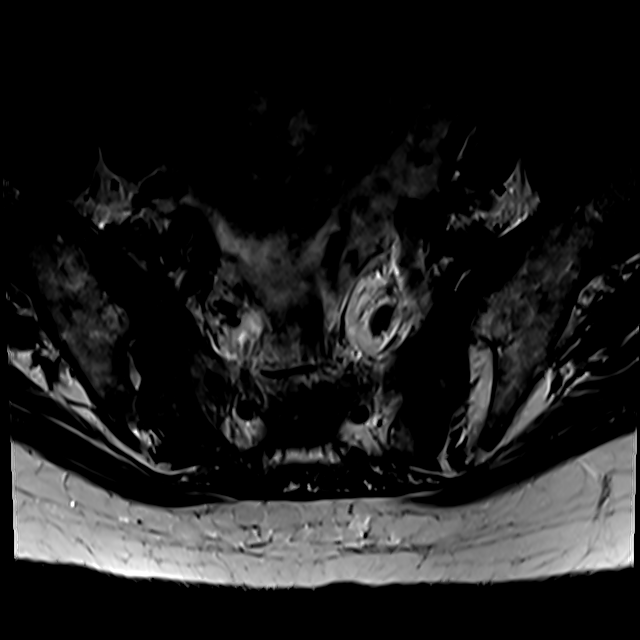
[im 8/50]
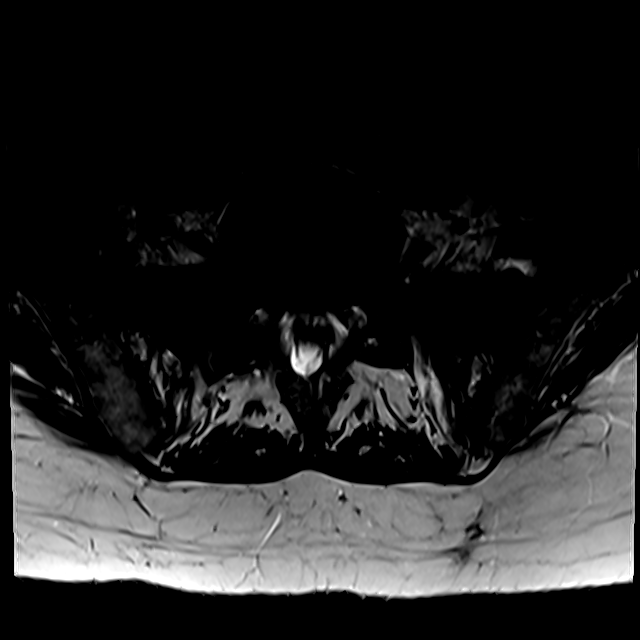
[im 15/50]
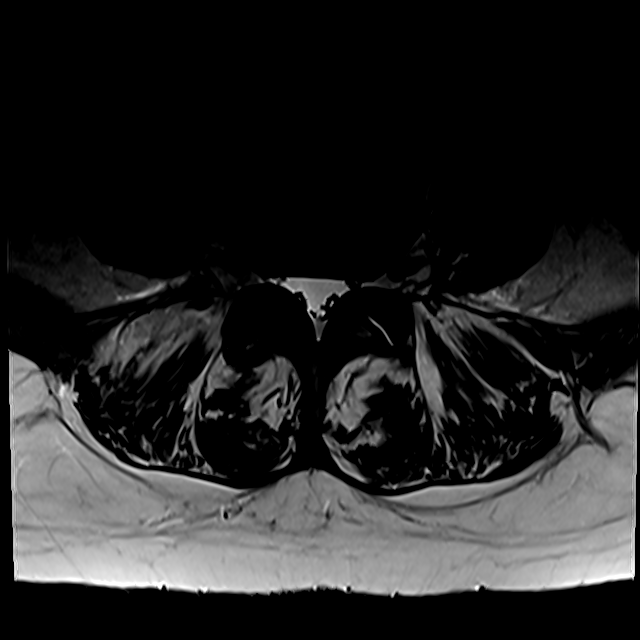
[im 22/50]
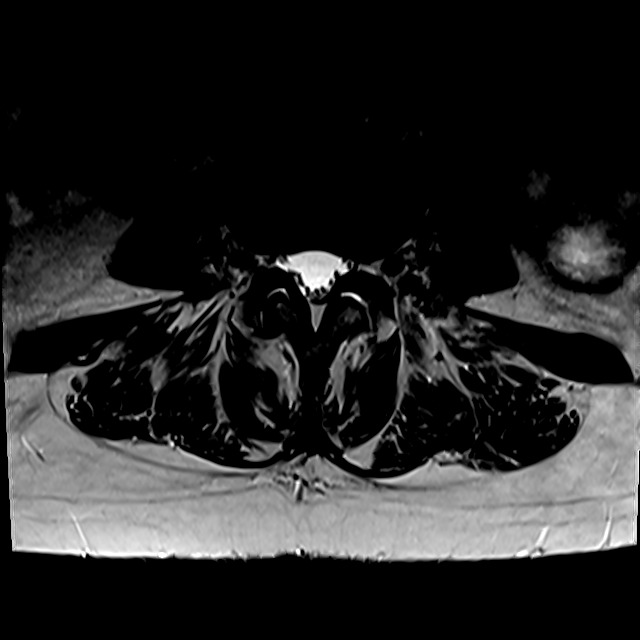
[im 25/50]
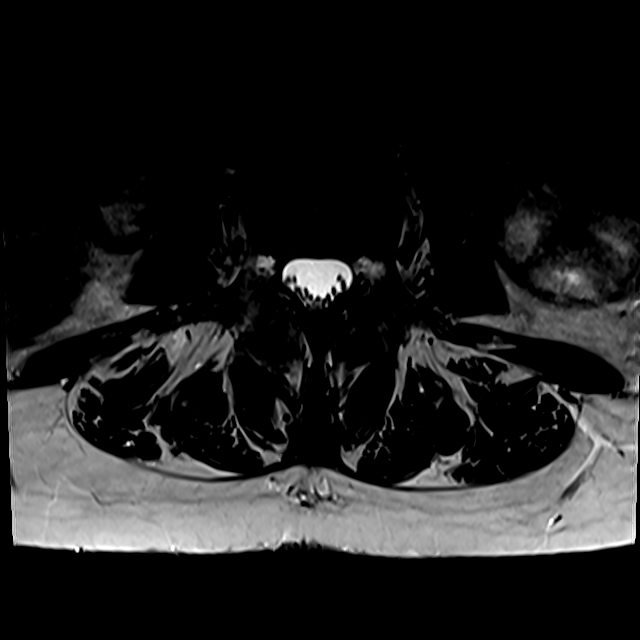
[im 29/50]
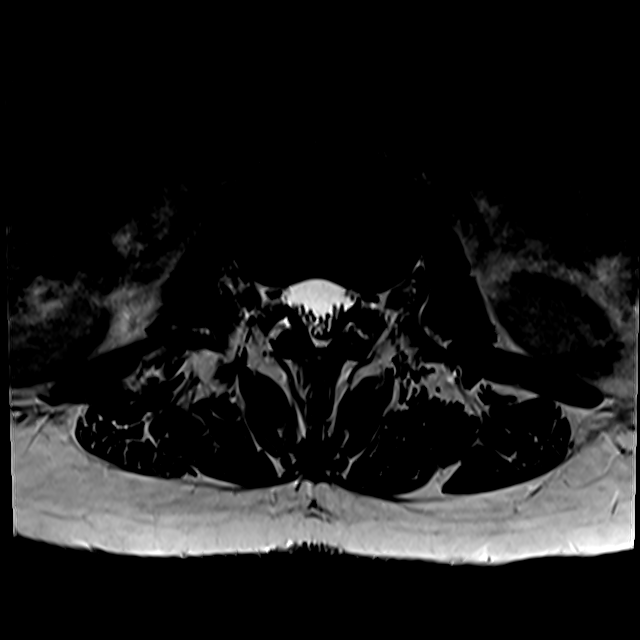
[im 36/50]
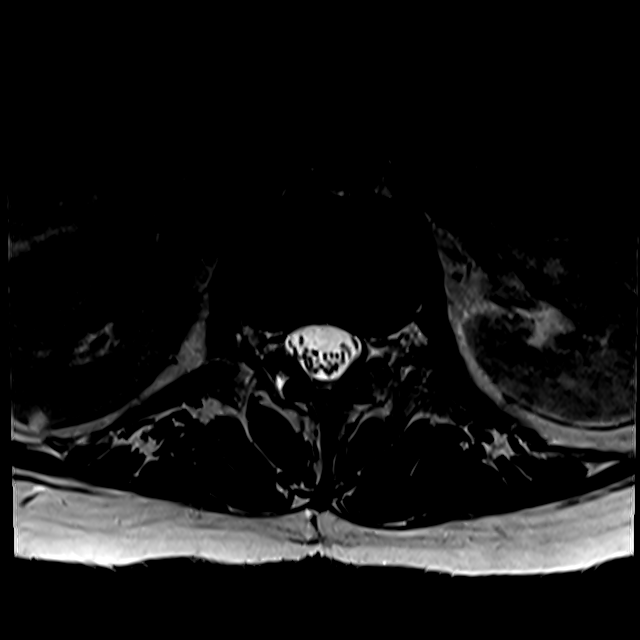
[im 43/50]
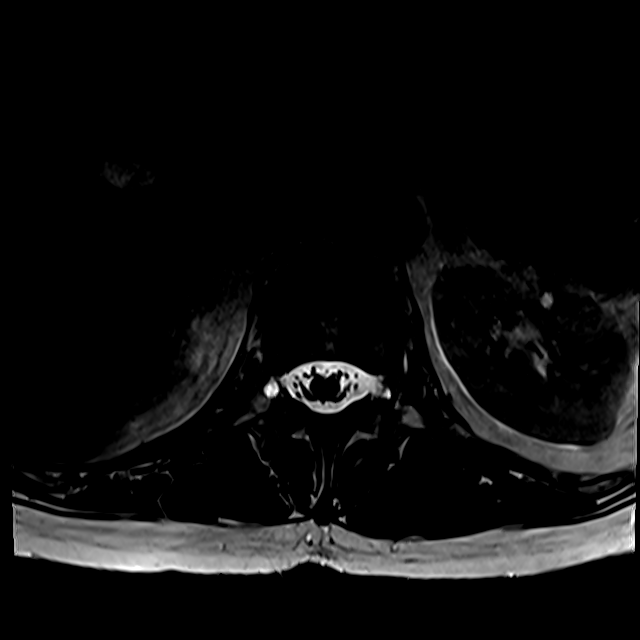
[im 50/50]
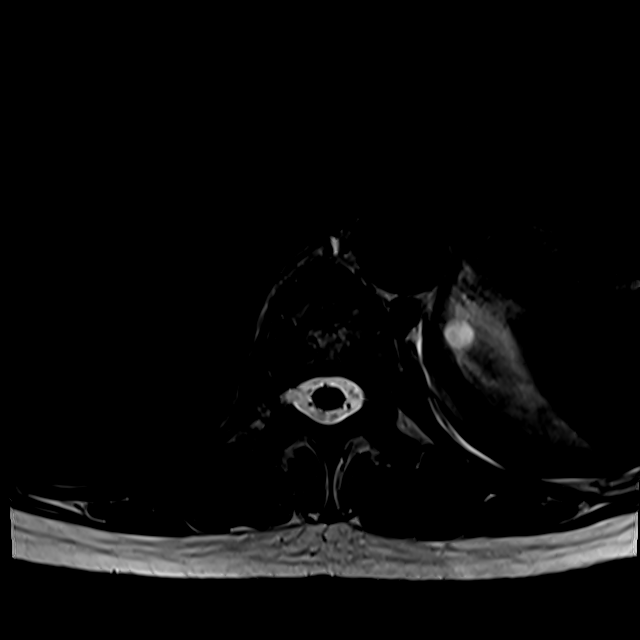

[Series 14: T2 · coronal · 5.0mm · 0.73mm/px · 3 of 16 slices shown (3 of 3)]
[im 1/16]
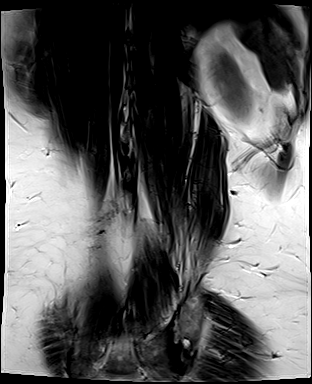
[im 8/16]
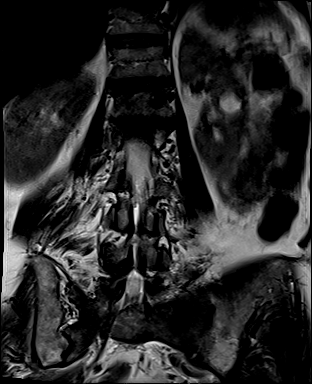
[im 16/16]
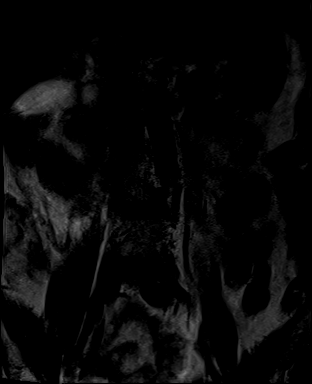

[20 of 48 positions shown; findings below may reference images not displayed]

FINDINGS: Segmentation: The lowest lumbar type non-rib-bearing vertebra is
labeled as L5.

Alignment:  No vertebral subluxation is observed.

Vertebrae: Chronic L1 compression fracture with 45% loss of
vertebral heights, and vertebral augmentation noted.

There are vertical fractures of both sacral ala with a transverse
fracture component at the S2 level, appearance strongly favoring
insufficiency fractures.

Suspected minimal superior endplate compression fracture at T11, no
change from 1179.

Conus medullaris and cauda equina: Conus extends to the L1 level.
Conus and cauda equina appear normal.

Paraspinal and other soft tissues: Bilateral renal fluid signal
intensity lesions favor cysts. Presacral edema.

Disc levels:

T11-12: Unremarkable.

T12-L1: No impingement. Mild disc bulge and 2 mm of degenerative
posterior subluxation in the central and right paracentral region
from the old L1 compression fracture.

L1-2: Unremarkable.

L2-3: Unremarkable

L3-4: No impingement.  Mild disc bulge.

L4-5: Borderline bilateral subarticular lateral recess stenosis due
to disc bulge and right greater than left degenerative facet
arthropathy.

L5-S1: Unremarkable.
IMPRESSION: 1. Extensive edema vertically in both sacral ala and transversely at
the S2 level, favoring sacral insufficiency fracture.
2. Prior vertebral augmentation at L1 with 45% loss of chronic
vertebral disc height and about 2 mm of chronic posterior bony
retropulsion at this level.
3. Borderline bilateral subarticular lateral recess stenosis at L4-5
due to disc bulge and facet arthropathy.

## 2024-05-05 DIAGNOSIS — D3101 Benign neoplasm of right conjunctiva: Secondary | ICD-10-CM | POA: Diagnosis not present

## 2024-05-05 DIAGNOSIS — H16223 Keratoconjunctivitis sicca, not specified as Sjogren's, bilateral: Secondary | ICD-10-CM | POA: Diagnosis not present

## 2024-05-05 DIAGNOSIS — H2513 Age-related nuclear cataract, bilateral: Secondary | ICD-10-CM | POA: Diagnosis not present

## 2024-06-29 ENCOUNTER — Other Ambulatory Visit: Payer: Self-pay | Admitting: Internal Medicine

## 2024-06-29 DIAGNOSIS — Z1231 Encounter for screening mammogram for malignant neoplasm of breast: Secondary | ICD-10-CM

## 2024-07-06 ENCOUNTER — Ambulatory Visit

## 2024-07-30 ENCOUNTER — Ambulatory Visit
Admission: RE | Admit: 2024-07-30 | Discharge: 2024-07-30 | Disposition: A | Discharge status: Home / Self Care | Source: Ambulatory Visit | Attending: Internal Medicine | Admitting: Internal Medicine

## 2024-07-30 DIAGNOSIS — Z1231 Encounter for screening mammogram for malignant neoplasm of breast: Secondary | ICD-10-CM | POA: Diagnosis not present

## 2024-10-27 DIAGNOSIS — G720 Drug-induced myopathy: Secondary | ICD-10-CM | POA: Diagnosis not present

## 2024-10-27 DIAGNOSIS — E559 Vitamin D deficiency, unspecified: Secondary | ICD-10-CM | POA: Diagnosis not present

## 2024-10-27 DIAGNOSIS — I700 Atherosclerosis of aorta: Secondary | ICD-10-CM | POA: Diagnosis not present

## 2024-10-27 DIAGNOSIS — Z862 Personal history of diseases of the blood and blood-forming organs and certain disorders involving the immune mechanism: Secondary | ICD-10-CM | POA: Diagnosis not present

## 2024-10-27 DIAGNOSIS — M8580 Other specified disorders of bone density and structure, unspecified site: Secondary | ICD-10-CM | POA: Diagnosis not present

## 2024-10-27 DIAGNOSIS — Z8781 Personal history of (healed) traumatic fracture: Secondary | ICD-10-CM | POA: Diagnosis not present

## 2024-10-27 DIAGNOSIS — E7800 Pure hypercholesterolemia, unspecified: Secondary | ICD-10-CM | POA: Diagnosis not present

## 2024-10-27 DIAGNOSIS — Z0000 Encounter for general adult medical examination without abnormal findings: Secondary | ICD-10-CM | POA: Diagnosis not present

## 2024-10-27 DIAGNOSIS — D7589 Other specified diseases of blood and blood-forming organs: Secondary | ICD-10-CM | POA: Diagnosis not present

## 2024-10-27 DIAGNOSIS — Z23 Encounter for immunization: Secondary | ICD-10-CM | POA: Diagnosis not present

## 2024-10-27 DIAGNOSIS — F17211 Nicotine dependence, cigarettes, in remission: Secondary | ICD-10-CM | POA: Diagnosis not present

## 2024-10-27 DIAGNOSIS — E039 Hypothyroidism, unspecified: Secondary | ICD-10-CM | POA: Diagnosis not present
